# Patient Record
Sex: Male | Born: 1985 | Race: White | Hispanic: No | Marital: Single | Smoking: Former smoker
Health system: Southern US, Community
[De-identification: ages and names within clinical notes are randomized; demographics above are authoritative.]

## PROBLEM LIST (undated history)

## (undated) DIAGNOSIS — F988 Other specified behavioral and emotional disorders with onset usually occurring in childhood and adolescence: Secondary | ICD-10-CM

## (undated) DIAGNOSIS — K760 Fatty (change of) liver, not elsewhere classified: Secondary | ICD-10-CM

## (undated) DIAGNOSIS — K7581 Nonalcoholic steatohepatitis (NASH): Secondary | ICD-10-CM

## (undated) DIAGNOSIS — I447 Left bundle-branch block, unspecified: Secondary | ICD-10-CM

## (undated) DIAGNOSIS — G5600 Carpal tunnel syndrome, unspecified upper limb: Secondary | ICD-10-CM

## (undated) DIAGNOSIS — F419 Anxiety disorder, unspecified: Secondary | ICD-10-CM

## (undated) DIAGNOSIS — R202 Paresthesia of skin: Secondary | ICD-10-CM

## (undated) DIAGNOSIS — R079 Chest pain, unspecified: Secondary | ICD-10-CM

## (undated) DIAGNOSIS — J309 Allergic rhinitis, unspecified: Secondary | ICD-10-CM

## (undated) DIAGNOSIS — E785 Hyperlipidemia, unspecified: Secondary | ICD-10-CM

## (undated) DIAGNOSIS — I10 Essential (primary) hypertension: Secondary | ICD-10-CM

## (undated) DIAGNOSIS — Z6841 Body Mass Index (BMI) 40.0 and over, adult: Secondary | ICD-10-CM

## (undated) DIAGNOSIS — E669 Obesity, unspecified: Secondary | ICD-10-CM

## (undated) HISTORY — DX: Fatty (change of) liver, not elsewhere classified: K76.0

## (undated) HISTORY — DX: Other specified behavioral and emotional disorders with onset usually occurring in childhood and adolescence: F98.8

## (undated) HISTORY — PX: KNEE SURGERY: SHX244

## (undated) HISTORY — DX: Hyperlipidemia, unspecified: E78.5

## (undated) HISTORY — DX: Obesity, unspecified: E66.9

## (undated) HISTORY — DX: Nonalcoholic steatohepatitis (NASH): K75.81

## (undated) HISTORY — DX: Allergic rhinitis, unspecified: J30.9

## (undated) HISTORY — DX: Left bundle-branch block, unspecified: I44.7

## (undated) HISTORY — DX: Chest pain, unspecified: R07.9

## (undated) HISTORY — DX: Carpal tunnel syndrome, unspecified upper limb: G56.00

## (undated) HISTORY — DX: Essential (primary) hypertension: I10

## (undated) HISTORY — DX: Anxiety disorder, unspecified: F41.9

## (undated) HISTORY — DX: Paresthesia of skin: R20.2

## (undated) HISTORY — DX: Body Mass Index (BMI) 40.0 and over, adult: Z684

---
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
## 2009-08-09 ENCOUNTER — Emergency Department (HOSPITAL_COMMUNITY): Admission: EM | Admit: 2009-08-09 | Discharge: 2009-08-09 | Payer: Self-pay | Admitting: Emergency Medicine

## 2010-05-02 LAB — RAPID URINE DRUG SCREEN, HOSP PERFORMED
Amphetamines: POSITIVE — AB
Benzodiazepines: POSITIVE — AB
Opiates: NOT DETECTED

## 2010-05-02 LAB — POCT I-STAT, CHEM 8
BUN: 11 mg/dL (ref 6–23)
Calcium, Ion: 1.13 mmol/L (ref 1.12–1.32)
Chloride: 104 mEq/L (ref 96–112)
Creatinine, Ser: 1 mg/dL (ref 0.4–1.5)
Potassium: 3.7 mEq/L (ref 3.5–5.1)
TCO2: 22 mmol/L (ref 0–100)

## 2010-05-02 LAB — URINALYSIS, ROUTINE W REFLEX MICROSCOPIC
Bilirubin Urine: NEGATIVE
Glucose, UA: NEGATIVE mg/dL
Nitrite: NEGATIVE
Protein, ur: NEGATIVE mg/dL
Urobilinogen, UA: 0.2 mg/dL (ref 0.0–1.0)

## 2010-05-02 LAB — POCT CARDIAC MARKERS
CKMB, poc: 2 ng/mL (ref 1.0–8.0)
Myoglobin, poc: 118 ng/mL (ref 12–200)

## 2012-08-22 ENCOUNTER — Other Ambulatory Visit: Payer: Self-pay | Admitting: Family Medicine

## 2012-08-28 ENCOUNTER — Ambulatory Visit
Admission: RE | Admit: 2012-08-28 | Discharge: 2012-08-28 | Disposition: A | Discharge status: Home / Self Care | Payer: BC Managed Care – PPO | Source: Ambulatory Visit | Attending: Family Medicine | Admitting: Family Medicine

## 2013-02-28 ENCOUNTER — Encounter: Payer: BC Managed Care – PPO | Attending: Family Medicine | Admitting: *Deleted

## 2013-02-28 ENCOUNTER — Encounter: Payer: Self-pay | Admitting: *Deleted

## 2013-02-28 VITALS — Ht 70.5 in | Wt 325.0 lb

## 2013-02-28 DIAGNOSIS — Z87891 Personal history of nicotine dependence: Secondary | ICD-10-CM | POA: Insufficient documentation

## 2013-02-28 DIAGNOSIS — E785 Hyperlipidemia, unspecified: Secondary | ICD-10-CM | POA: Insufficient documentation

## 2013-02-28 DIAGNOSIS — K760 Fatty (change of) liver, not elsewhere classified: Secondary | ICD-10-CM

## 2013-02-28 DIAGNOSIS — E669 Obesity, unspecified: Secondary | ICD-10-CM | POA: Insufficient documentation

## 2013-02-28 DIAGNOSIS — K7689 Other specified diseases of liver: Secondary | ICD-10-CM | POA: Insufficient documentation

## 2013-02-28 DIAGNOSIS — Z713 Dietary counseling and surveillance: Secondary | ICD-10-CM | POA: Insufficient documentation

## 2013-02-28 NOTE — Patient Instructions (Signed)
Aim for physical activity 2 days: water, elliptical, recumbent bike Switch to vegetable oil for cooking instead of butter or margarine Incorporate more whole grains at home: brown rice, whole wheat bread, wheat pasta Try frozen vegetables each night Take apples as snacks  Try grilled chicken instead of fried chicken tenders at work Switch salad dressing to [place] or vinaigrette instead of ranch Avoid trans fats: margarine, crisco, prepackage baked goods Pizza once a week with thin crust, cheese   And have vegetable with pizza  Experiment with different lunch and breakfast.  Buy groceries for H. J. Heinztheses meals (egg muffin sandwich) Bring something for lunch  Turn off tv while eating.  Slow down.  Aim to make meals last at least 20 minutes.

## 2013-02-28 NOTE — Progress Notes (Signed)
Medical Nutrition Therapy:  Appt start time: 0800 end time:  0900.   Assessment:  Primary concerns today: Jamie Wolf is here for nutrition counseling pertaining to hyperlipidemia and fatty liver, exacerbated by obesity.  He used to smoke and after he quit he gained over 30 pounds.  He played sports in high school and was never concerned about his weight, but now he's not as active.  He is worried about his health and his knees.   Several family members have had bariatric surgery and he doesn't want to have to make those decisions.  He has attempted to lose weight on his own through [employer] and lost over 40 pounds.  He also exercised more on that diet, but then he stopped the diet and gained it back.   As an adult his heaviest weight is currently 325.  His lowest as an adult was 240 lb and he would like to weigh 250.   He lives with his fiance.  They share food shopping responsibility and cooking responsibility.  They cook more at home now; they used to eat out more.  His fiance is trying to be healthy too so Jamie Wolf has support.  They eat out 1-2 times a week, vs 3-4 times.  They do not fry anything at home.  They eat while watching tv.  He reports being a really fast eater.   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  He works 7-5 5 tays a week. most of the time.  Sometimes his schedule changes though.  He describes himself as a binge-eater.  Especially on the days off he will eat a lot at one time: whole pizza.  He states that he craves carbs.  He rarely drinks alcohol.    Preferred Learning Style:   Auditory  Learning Readiness:   Ready  MEDICATIONS: Vyvanse  DIETARY INTAKE:  Usual eating pattern includes 1-3 meals and maybe ad lib throughout the day.  Everyday foods include starches, fatty meats.      24-hr recall:  When off work, he grazes throughout the day B ( AM): McDonald's with jucie.  On days off used to go out also.  Now is  doing [place] bacon Snk ( AM):  Not at work L ( PM): something from work Snk ( PM): not at work D ( PM): rice and beans; spaghetti.  Might go out: [place] hibachi (stopped getting fried rice, tries to get steamed or brown rice) Snk ( PM): leftovers from dinner.  Chips and salsa Beverages: water with [place]. Powerade Zero   Usual physical activity: stands and walks all day at work.  Walks dog.  Knees hurt on concrete  Estimated energy needs: 2400 calories 270 g carbohydrates 180 g protein 67 g fat    Nutritional Diagnosis:  NI-5.6.2 Excessive fat intake As related to excessive consumption of saturated fats from fast food and pizza.  As evidenced by elevated cholesterol and fatty liver.    Intervention:  Nutrition counseling provided. Discussed heart healthy eating and limiting saturated and trans fats. Aim for physical activity 2 days: water, elliptical, recumbent bike Switch to vegetable oil for cooking instead of butter or margarine Incorporate more whole grains at home: brown rice, whole wheat bread, wheat pasta Try frozen vegetables each night Take apples as snacks Try grilled chicken instead of fried chicken tenders at work Switch salad dressing to [place] or vinaigrette instead of ranch Avoid trans  fats: margarine, crisco, prepackage baked goods Pizza once a week with thin crust, cheese   And have vegetable with pizza Experiment with different lunch and breakfast.  Buy groceries for theses meals (egg muffin sandwich) Bring something for lunch Turn off tv while eating.  Slow down.  Aim to make meals last at least 20 minutes.  Teaching Method Utilized:  Auditory  Handouts given during visit include: none  Barriers to learning/adherence to lifestyle change: foods at work, busy work schedule  Demonstrated degree of understanding via:  Teach Back   Monitoring/Evaluation:  Dietary intake, exercise, and body weight in 3 month(s).

## 2013-05-30 ENCOUNTER — Ambulatory Visit: Payer: BC Managed Care – PPO | Admitting: *Deleted

## 2016-07-01 ENCOUNTER — Other Ambulatory Visit: Payer: Self-pay | Admitting: Family Medicine

## 2016-07-01 DIAGNOSIS — R202 Paresthesia of skin: Secondary | ICD-10-CM

## 2016-07-08 ENCOUNTER — Ambulatory Visit
Admission: RE | Admit: 2016-07-08 | Discharge: 2016-07-08 | Disposition: A | Discharge status: Home / Self Care | Payer: 59 | Source: Ambulatory Visit | Attending: Family Medicine | Admitting: Family Medicine

## 2016-07-08 DIAGNOSIS — R202 Paresthesia of skin: Secondary | ICD-10-CM

## 2016-07-08 MED ORDER — GADOBENATE DIMEGLUMINE 529 MG/ML IV SOLN
20.0000 mL | Freq: Once | INTRAVENOUS | Status: AC | PRN
  Administered 2016-07-08: 20 mL via INTRAVENOUS

## 2017-06-12 ENCOUNTER — Other Ambulatory Visit (HOSPITAL_COMMUNITY): Payer: Self-pay | Admitting: Family Medicine

## 2017-06-12 DIAGNOSIS — I447 Left bundle-branch block, unspecified: Secondary | ICD-10-CM

## 2017-06-13 ENCOUNTER — Telehealth: Payer: Self-pay

## 2017-06-13 NOTE — Telephone Encounter (Signed)
Sent referral to scheduling 

## 2017-06-14 ENCOUNTER — Other Ambulatory Visit: Payer: Self-pay

## 2017-06-14 ENCOUNTER — Ambulatory Visit (HOSPITAL_COMMUNITY): Payer: Managed Care, Other (non HMO) | Attending: Cardiology

## 2017-06-14 DIAGNOSIS — R002 Palpitations: Secondary | ICD-10-CM | POA: Insufficient documentation

## 2017-06-14 DIAGNOSIS — R079 Chest pain, unspecified: Secondary | ICD-10-CM | POA: Insufficient documentation

## 2017-06-14 DIAGNOSIS — F410 Panic disorder [episodic paroxysmal anxiety] without agoraphobia: Secondary | ICD-10-CM | POA: Insufficient documentation

## 2017-06-14 DIAGNOSIS — Z87891 Personal history of nicotine dependence: Secondary | ICD-10-CM | POA: Insufficient documentation

## 2017-06-14 DIAGNOSIS — E785 Hyperlipidemia, unspecified: Secondary | ICD-10-CM | POA: Diagnosis not present

## 2017-06-14 DIAGNOSIS — I10 Essential (primary) hypertension: Secondary | ICD-10-CM | POA: Diagnosis not present

## 2017-06-14 DIAGNOSIS — Z8249 Family history of ischemic heart disease and other diseases of the circulatory system: Secondary | ICD-10-CM | POA: Insufficient documentation

## 2017-06-14 DIAGNOSIS — I447 Left bundle-branch block, unspecified: Secondary | ICD-10-CM | POA: Diagnosis not present

## 2017-06-14 MED ORDER — PERFLUTREN LIPID MICROSPHERE
1.0000 mL | INTRAVENOUS | Status: AC | PRN
  Administered 2017-06-14: 2 mL via INTRAVENOUS

## 2017-06-29 ENCOUNTER — Encounter: Payer: Self-pay | Admitting: Interventional Cardiology

## 2017-06-29 ENCOUNTER — Ambulatory Visit (INDEPENDENT_AMBULATORY_CARE_PROVIDER_SITE_OTHER): Payer: Managed Care, Other (non HMO) | Admitting: Interventional Cardiology

## 2017-06-29 VITALS — BP 128/70 | HR 100 | Ht 70.5 in | Wt 325.0 lb

## 2017-06-29 DIAGNOSIS — R079 Chest pain, unspecified: Secondary | ICD-10-CM

## 2017-06-29 NOTE — Patient Instructions (Signed)
Medication Instructions:  Your physician recommends that you continue on your current medications as directed. Please refer to the Current Medication list given to you today.   Labwork: None ordered  Testing/Procedures: Your physician has requested that you have an exercise tolerance test. For further information please visit [email or web address] Please also follow instruction sheet, as given.  Follow-Up: Your physician recommends that you follow-up with Dr. Eldridge Dace AS NEEDED   Any Other Special Instructions Will Be Listed Below (If Applicable).   Exercise Stress Electrocardiogram An exercise stress electrocardiogram is a test to check how blood flows to your heart. It is done to find areas of poor blood flow. You will need to walk on a treadmill for this test. The electrocardiogram will record your heartbeat when you are at rest and when you are exercising. What happens before the procedure?  Do not have drinks with caffeine or foods with caffeine for 24 hours before the test, or as told by your doctor. This includes coffee, tea (even decaf tea), sodas, chocolate, and cocoa.  Follow your doctor's instructions about eating and drinking before the test.  Ask your doctor what medicines you should or should not take before the test. Take your medicines with water unless told by your doctor not to.  If you use an inhaler, bring it with you to the test.  Bring a snack to eat after the test.  Do not  smoke for 4 hours before the test.  Do not put lotions, powders, creams, or oils on your chest before the test.  Wear comfortable shoes and clothing. What happens during the procedure?  You will have patches put on your chest. Small areas of your chest may need to be shaved. Wires will be connected to the patches.  Your heart rate will be watched while you are resting and while you are exercising.  You will walk on the treadmill. The treadmill will slowly get faster to raise your  heart rate.  The test will take about 1-2 hours. What happens after the procedure?  Your heart rate and blood pressure will be watched after the test.  You may return to your normal diet, activities, and medicines or as told by your doctor. This information is not intended to replace advice given to you by your health care provider. Make sure you discuss any questions you have with your health care provider. Document Released: 07/20/2007 Document Revised: 09/30/2015 Document Reviewed: 10/08/2012 Elsevier Interactive Patient Education  [employer].    If you need a refill on your cardiac medications before your next appointment, please call your pharmacy.

## 2017-06-29 NOTE — Progress Notes (Signed)
Cardiology Office Note   Date:  06/29/2017   ID:  Jamie Wolf, DOB 1985/10/07, MRN [id number]  PCP:  Mila Palmer, MD    No chief complaint on file.  Chest discomfort  Wt Readings from Last 3 Encounters:  06/29/17 (!) 325 lb (147.4 kg)  02/28/13 (!) 325 lb (147.4 kg)       History of Present Illness: Jamie Wolf is a 32 y.o. male who is being seen today for the evaluation of chest discomfort at the request of Mila Palmer, MD.  He has had vague chest, shoulder and arm discomfort/tingling with anxiety.  It occurred recently with MJ smoking.  He stopped this several weeks ago.  Sx are better, but not gone.  After he found out that his echo was normal, sx are gone.  No associated sweating or nausea.   He describes the sensation of a skipped beat with stress and immediately after exercise.    His father uses Ativan for anxiety.  He plays golf and plays tennis.  He walks his dogs.  He plans to go to the gym when the cardiac w/u is done.  He has never had to stop exercise due to any sx.    Denies : Dizziness. Leg edema. Nitroglycerin use. Orthopnea. Palpitations. Paroxysmal nocturnal dyspnea. Shortness of breath. Syncope.       Past Medical History:  Diagnosis Date  . ADD (attention deficit disorder)   . Allergic rhinitis   . Anxiety   . BMI 40.0-44.9, adult (HCC)   . Carpal tunnel syndrome   . Chest pain   . Fatty liver   . Hyperlipidemia   . Hypertension   . LBBB (left bundle branch block)   . Nonalcoholic steatohepatitis   . Obesity   . Paresthesia     Past Surgical History:  Procedure Laterality Date  . KNEE SURGERY       Current Outpatient Medications  Medication Sig Dispense Refill  . omeprazole (PRILOSEC) 20 MG capsule Take 20 mg by mouth daily.     No current facility-administered medications for this visit.     Allergies:   Amoxicillin; Penicillin g; and Penicillins    Social History:  The patient  reports that he has quit  smoking. He has never used smokeless tobacco. He reports that he drinks alcohol. He reports that he has current or past drug history.   Family History:  The patient's family history includes Asthma in his other; Cancer in his other; Diabetes in his other; Heart attack in his other; Hyperlipidemia in his other; Obesity in his other; Other in his sister and sister.    ROS:  Please see the history of present illness.   Otherwise, review of systems are positive for difficulty losing weight.   All other systems are reviewed and negative.    PHYSICAL EXAM: VS:  BP 128/70 (BP Location: Left Arm, Patient Position: Sitting, Cuff Size: Large)   Pulse 100   Ht 5' 10.5" (1.791 m)   Wt (!) 325 lb (147.4 kg)   SpO2 98%   BMI 45.97 kg/m  , BMI Body mass index is 45.97 kg/m. GEN: Well nourished, well developed, in no acute distress  HEENT: normal  Neck: no JVD, carotid bruits, or masses Cardiac: RRR; no murmurs, rubs, or gallops,no edema, 2+ right dorsalis pedis pulse Respiratory:  clear to auscultation bilaterally, normal work of breathing GI: soft, nontender, nondistended, + BS, obese MS: no deformity or atrophy  Skin: warm and dry, no  rash Neuro:  Strength and sensation are intact Psych: euthymic mood, full affect   EKG:   The ekg ordered today demonstrates NSR, no ST changes   Recent Labs: No results found for requested labs within last 8760 hours.   Lipid Panel No results found for: CHOL, TRIG, HDL, CHOLHDL, VLDL, LDLCALC, LDLDIRECT   Other studies Reviewed: Additional studies/ records that were reviewed today with results demonstrating: Labs reviewed.  LDL 104 in April 2019.  A1c 4.9, creatinine 0.93.   ASSESSMENT AND PLAN:  1. Chest discomfort: Very atypical.  I think it is unlikely that his symptoms are due to ischemia.  However, he is planning on getting on an exercise regimen.  We will plan for exercise treadmill test to provide reassurance and to give him targets for  exercise. 2. Obesity: We spoke about eating quality foods and avoiding starches and prepackaged foods.  He needs to avoid sugary drinks as well.  This would likely provide an immediate weight loss which would be sustainable. 3. LDL 104 at most recent check. 4. I reviewed his ECG.  I do not see any significant abnormality, certainly not anything that would interfere with the interpretation of the stress test.  He does not have a left bundle branch block.   Current medicines are reviewed at length with the patient today.  The patient concerns regarding his medicines were addressed.  The following changes have been made:  No change  Labs/ tests ordered today include:  No orders of the defined types were placed in this encounter.   Recommend 150 minutes/week of aerobic exercise Low fat, low carb, high fiber diet recommended  Disposition:   FU for treadmill test   Signed, Lance Muss, MD  06/29/2017 10:37 AM    [facility] [address] [place], [place], [place]  [address] Phone: [phone]; Fax: [phone]

## 2019-04-21 IMAGING — MR MR HEAD WO/W CM
14 series · 48 of 48 positions shown · IV contrast (multihance)
Comparison: None.

CLINICAL DATA: Left and occasional right lower extremity
paresthesias for 4 months. Evaluation for multiple sclerosis.

EXAM:
MRI HEAD WITHOUT AND WITH CONTRAST
TECHNIQUE: Multiplanar, multiecho pulse sequences of the brain and surrounding
structures were obtained without and with intravenous contrast.
CONTRAST:  20mL MULTIHANCE GADOBENATE DIMEGLUMINE 529 MG/ML IV SOLN

[Series 5: T1 · sagittal · 4.0mm · 0.75mm/px · 1 of 31 slices shown (1 of 3)]
[im 1/31]
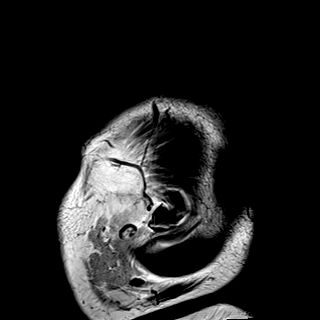

[Series 6: DWI · axial · 3.0mm · 1.44mm/px · z∈[-62,+86]mm · 5 of 92 slices shown (1 of 4)]
[im 1/92]
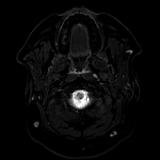
[im 23/92]
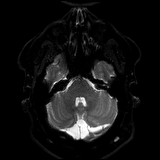
[im 46/92]
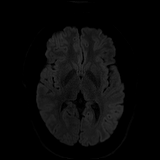
[im 69/92]
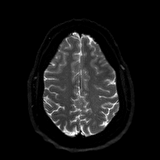
[im 92/92]
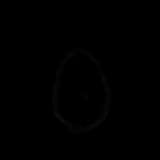

[Series 7: DWI · axial · 3.0mm · 1.44mm/px · z∈[-62,+86]mm · 3 of 46 slices shown (2 of 4)]
[im 1/46]
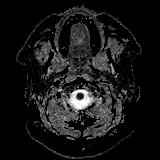
[im 23/46]
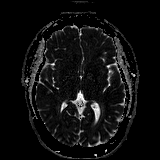
[im 46/46]
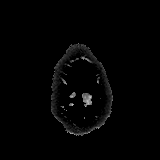

[Series 8: DWI · coronal · 5.0mm · 1.44mm/px · 3 of 60 slices shown (3 of 4)]
[im 1/60]
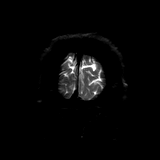
[im 30/60]
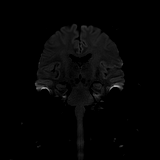
[im 60/60]
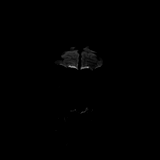

[Series 9: DWI · coronal · 5.0mm · 1.44mm/px · 2 of 30 slices shown (4 of 4)]
[im 1/30]
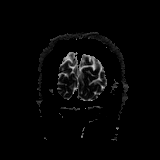
[im 30/30]
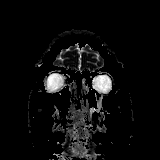

[Series 10: T2 · axial · 4.0mm · 0.36mm/px · z∈[-66,+89]mm · 2 of 31 slices shown]
[im 1/31]
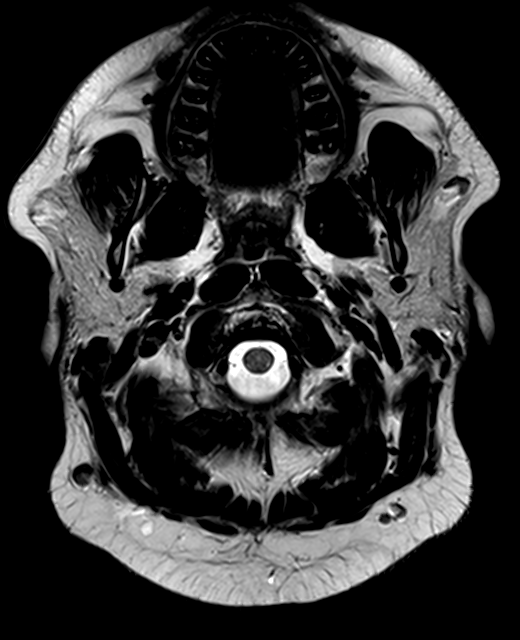
[im 31/31]
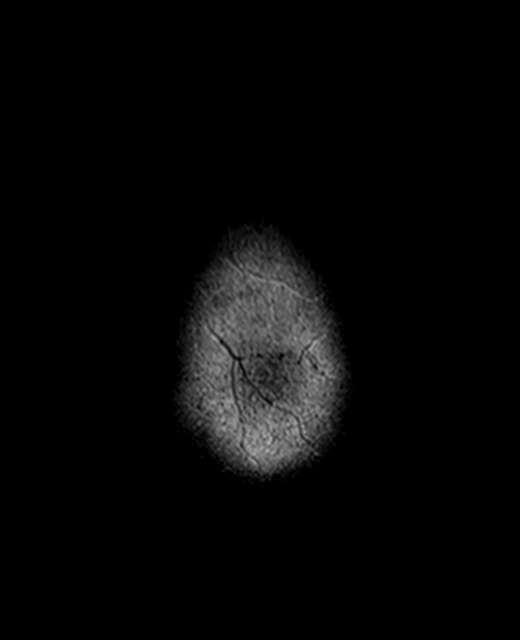

[Series 11: FLAIR · axial · 3.0mm · 0.72mm/px · 1 of 26 slices shown (1 of 2)]
[im 1/26]
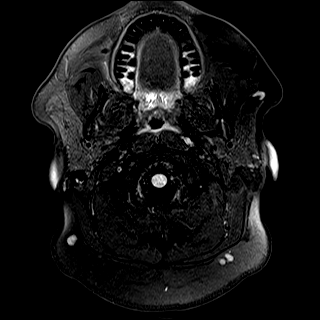

[Series 13: swi_images · axial · 1.5mm · 0.90mm/px · z∈[-59,+82]mm · 5 of 96 slices shown]
[im 1/96]
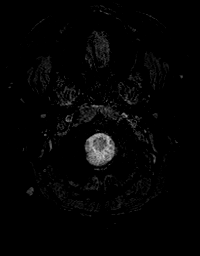
[im 24/96]
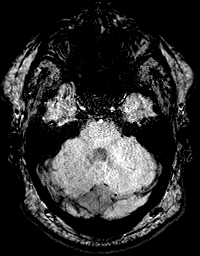
[im 48/96]
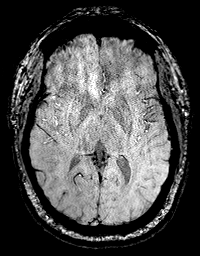
[im 72/96]
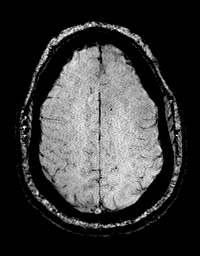
[im 96/96]
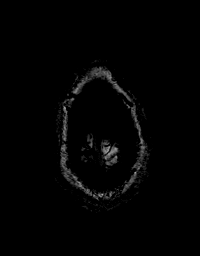

[Series 14: T1 · axial · 1.0mm · 0.90mm/px · z∈[-67,+90]mm · 9 of 160 slices shown (2 of 3)]
[im 1/160]
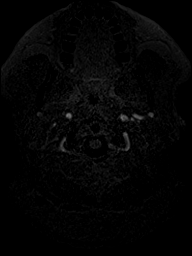
[im 20/160]
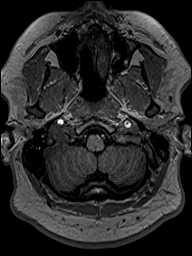
[im 40/160]
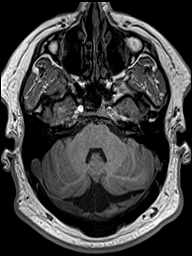
[im 60/160]
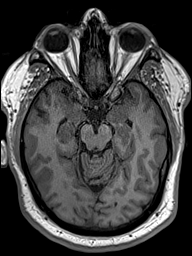
[im 80/160]
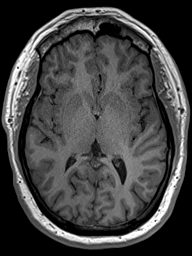
[im 100/160]
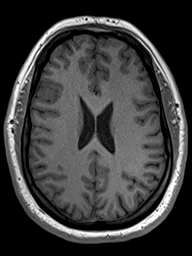
[im 120/160]
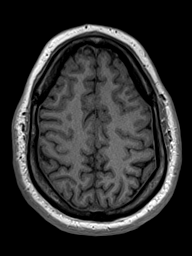
[im 140/160]
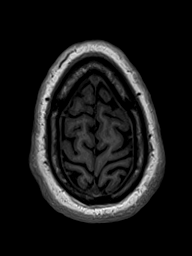
[im 160/160]
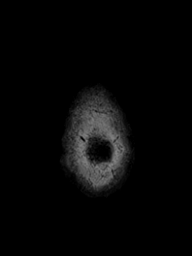

[Series 15: FLAIR · sagittal · 4.0mm · 0.72mm/px · 2 of 27 slices shown (2 of 2)]
[im 1/27]
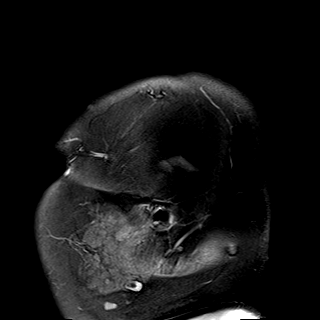
[im 27/27]
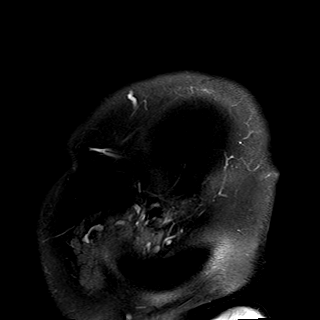

[Series 16: T2 post-contrast · coronal · 4.5mm · 0.36mm/px · 2 of 32 slices shown]
[im 1/32]
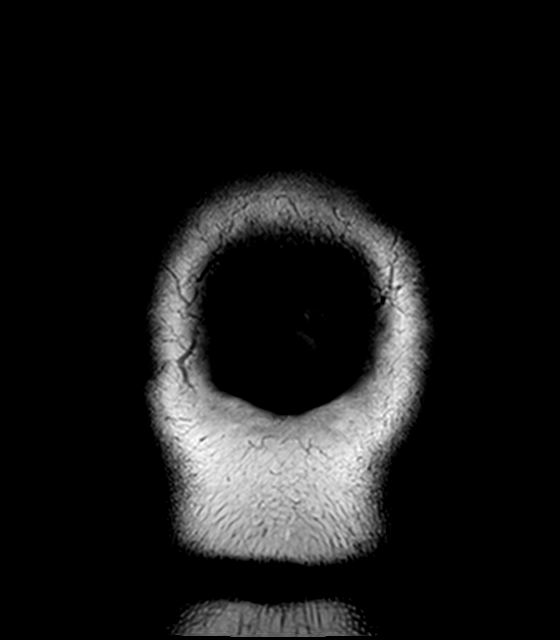
[im 32/32]
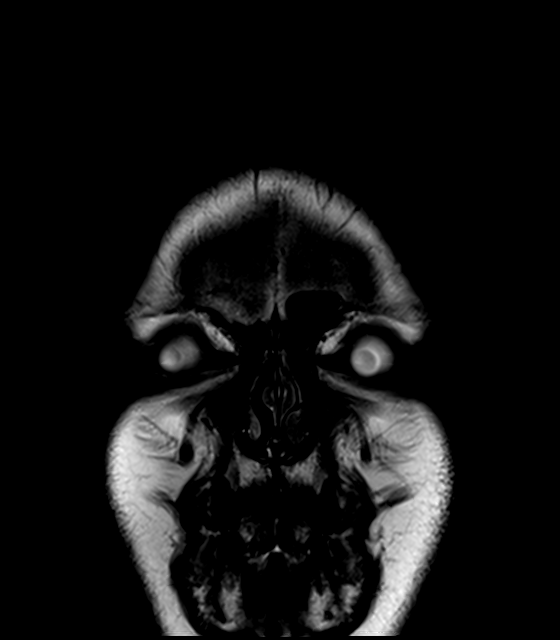

[Series 17: T1 · axial · 1.0mm · 0.90mm/px · z∈[-67,+90]mm · 9 of 160 slices shown (3 of 3)]
[im 1/160]
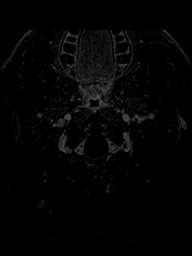
[im 20/160]
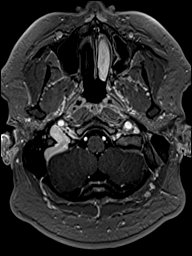
[im 40/160]
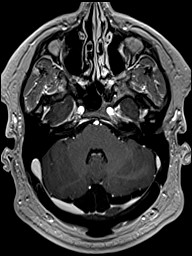
[im 60/160]
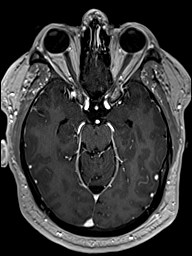
[im 80/160]
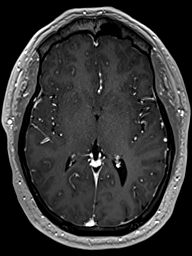
[im 100/160]
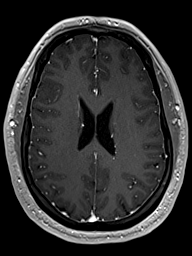
[im 120/160]
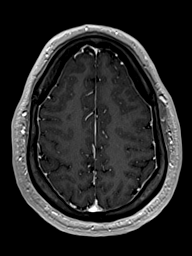
[im 140/160]
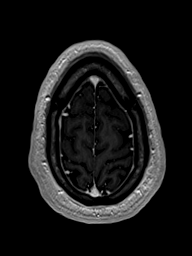
[im 160/160]
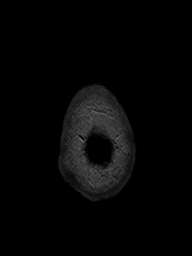

[Series 18: T1 post-contrast · coronal · 4.5mm · 0.72mm/px · 2 of 32 slices shown (1 of 2)]
[im 1/32]
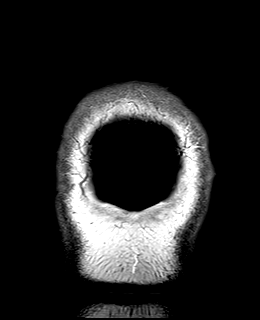
[im 32/32]
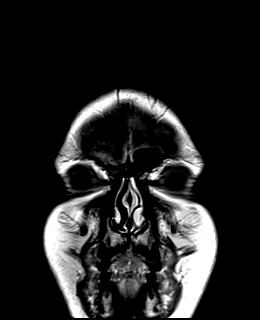

[Series 19: T1 post-contrast · sagittal · 4.0mm · 0.75mm/px · 2 of 31 slices shown (2 of 2)]
[im 1/31]
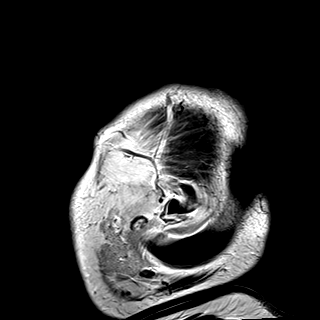
[im 31/31]
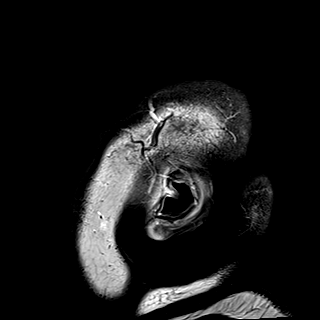

[48 of 48 positions shown; findings below may reference images not displayed]

FINDINGS: Brain: There is no evidence of acute infarct, intracranial
hemorrhage, mass, midline shift, or extra-axial fluid collection.
The ventricles and sulci are normal. The brain is normal in signal.
Specifically, no white matter changes are seen to suggest
demyelinating disease. A mega cisterna magna is incidentally noted,
a normal variant. No abnormal enhancement is identified.

Vascular: Major intracranial vascular flow voids are preserved.

Skull and upper cervical spine: Unremarkable bone marrow signal.

Sinuses/Orbits: Unremarkable orbits. Minimal left maxillary sinus
mucosal thickening. Trace right mastoid fluid.

Other: None.
IMPRESSION: Negative MR appearance of the brain. No evidence of demyelinating
disease.

## 2021-07-20 ENCOUNTER — Other Ambulatory Visit: Payer: Self-pay

## 2021-07-20 ENCOUNTER — Encounter (HOSPITAL_BASED_OUTPATIENT_CLINIC_OR_DEPARTMENT_OTHER): Payer: Self-pay

## 2021-07-20 ENCOUNTER — Emergency Department (HOSPITAL_BASED_OUTPATIENT_CLINIC_OR_DEPARTMENT_OTHER): Payer: Managed Care, Other (non HMO)

## 2021-07-20 ENCOUNTER — Emergency Department (HOSPITAL_BASED_OUTPATIENT_CLINIC_OR_DEPARTMENT_OTHER)
Admission: EM | Admit: 2021-07-20 | Discharge: 2021-07-20 | Disposition: A | Discharge status: Home / Self Care | Payer: Managed Care, Other (non HMO) | Attending: Emergency Medicine | Admitting: Emergency Medicine

## 2021-07-20 DIAGNOSIS — R002 Palpitations: Secondary | ICD-10-CM | POA: Insufficient documentation

## 2021-07-20 DIAGNOSIS — R5383 Other fatigue: Secondary | ICD-10-CM | POA: Diagnosis not present

## 2021-07-20 LAB — COMPREHENSIVE METABOLIC PANEL
ALT: 25 U/L (ref 0–44)
AST: 25 U/L (ref 15–41)
Albumin: 4 g/dL (ref 3.5–5.0)
Alkaline Phosphatase: 55 U/L (ref 38–126)
Anion gap: 8 (ref 5–15)
BUN: 14 mg/dL (ref 6–20)
CO2: 23 mmol/L (ref 22–32)
Calcium: 8.7 mg/dL — ABNORMAL LOW (ref 8.9–10.3)
Chloride: 107 mmol/L (ref 98–111)
Creatinine, Ser: 0.86 mg/dL (ref 0.61–1.24)
GFR, Estimated: >60 mL/min (ref >60)
Glucose, Bld: 106 mg/dL — ABNORMAL HIGH (ref 70–99)
Potassium: 3.5 mmol/L (ref 3.5–5.1)
Sodium: 138 mmol/L (ref 135–145)
Total Bilirubin: 1 mg/dL (ref 0.3–1.2)
Total Protein: 7.2 g/dL (ref 6.5–8.1)

## 2021-07-20 LAB — CBC WITH DIFFERENTIAL/PLATELET
Abs Immature Granulocytes: 0.16 10*3/uL — ABNORMAL HIGH (ref 0.00–0.07)
Basophils Absolute: 0 10*3/uL (ref 0.0–0.1)
Basophils Relative: 1 %
Eosinophils Absolute: 0.1 10*3/uL (ref 0.0–0.5)
Eosinophils Relative: 1 %
HCT: 42.7 % (ref 39.0–52.0)
Hemoglobin: 14.6 g/dL (ref 13.0–17.0)
Immature Granulocytes: 2 %
Lymphocytes Relative: 16 %
Lymphs Abs: 1.4 10*3/uL (ref 0.7–4.0)
MCH: 28.7 pg (ref 26.0–34.0)
MCHC: 34.2 g/dL (ref 30.0–36.0)
MCV: 83.9 fL (ref 80.0–100.0)
Monocytes Absolute: 0.6 10*3/uL (ref 0.1–1.0)
Monocytes Relative: 7 %
Neutro Abs: 6.5 10*3/uL (ref 1.7–7.7)
Neutrophils Relative %: 73 %
Platelets: 239 10*3/uL (ref 150–400)
RBC: 5.09 MIL/uL (ref 4.22–5.81)
RDW: 14.7 % (ref 11.5–15.5)
WBC: 8.7 10*3/uL (ref 4.0–10.5)
nRBC: 0 % (ref 0.0–0.2)

## 2021-07-20 LAB — MAGNESIUM: Magnesium: 2.1 mg/dL (ref 1.7–2.4)

## 2021-07-20 NOTE — Discharge Instructions (Signed)
Follow-up with your primary care provider for further evaluation. Return to the ER if you start to experience chest pain, shortness of breath, uncontrollable vomiting, increase in your palpitations, leg swelling or coughing up blood

## 2021-07-20 NOTE — ED Provider Notes (Signed)
[facility] EMERGENCY DEPARTMENT Provider Note   CSN: [id number] Arrival date & time: 07/20/21  1045     History  Chief Complaint  Patient presents with   Palpitations    Jamie Wolf is a 36 y.o. male presenting to the ED with a chief complaint of palpitations and fluttering in his chest.  Symptoms began when he woke up this morning.  Reports associated "feeling flushed" and generally fatigued.  Felt like his heart rate was increasing and then decreasing without any specific cause while he was sitting in his bed.  He was started on oral steroids on 07/17/2021 for supposedly a poison oak rash.  He has taken steroids in the past but has never experienced the symptoms before.  He denies any chest pain, abdominal pain, nausea, vomiting, diarrhea, fever, leg swelling, recent immobilization, history of DVT or PE.  Patient was evaluated by cardiology approximately 4 years ago for atypical chest pain.  He underwent an echo and was cleared from a cardiology standpoint after reassuring work-up.  He denies any other new medication or lifestyle changes.  He is currently on Adderall but has been on this medication for about 10 years.  HPI     Home Medications Prior to Admission medications   Medication Sig Start Date End Date Taking? Authorizing Provider  omeprazole (PRILOSEC) 20 MG capsule Take 20 mg by mouth daily.    [provider]      Allergies    Amoxicillin, Penicillin g, and Penicillins    Review of Systems   Review of Systems  Constitutional:  Negative for appetite change, chills and fever.  HENT:  Negative for ear pain, rhinorrhea, sneezing and sore throat.   Eyes:  Negative for photophobia and visual disturbance.  Respiratory:  Negative for cough, chest tightness, shortness of breath and wheezing.   Cardiovascular:  Positive for palpitations. Negative for chest pain.  Gastrointestinal:  Negative for abdominal pain, blood in stool, constipation, diarrhea, nausea  and vomiting.  Genitourinary:  Negative for dysuria, hematuria and urgency.  Musculoskeletal:  Negative for myalgias.  Skin:  Negative for rash.  Neurological:  Negative for dizziness, weakness and light-headedness.   Physical Exam Updated Vital Signs BP 128/82   Pulse 66   Temp 98 F (36.7 C) (Oral)   Resp 20   Ht 6' (1.829 m)   Wt 132 kg   SpO2 96%   BMI 39.47 kg/m  Physical Exam Vitals and nursing note reviewed.  Constitutional:      General: He is not in acute distress.    Appearance: He is well-developed.     Comments: Speaking complete sentence without difficulty.  No signs of distress.  HENT:     Head: Normocephalic and atraumatic.     Nose: Nose normal.  Eyes:     General: No scleral icterus.       Left eye: No discharge.     Conjunctiva/sclera: Conjunctivae normal.  Cardiovascular:     Rate and Rhythm: Normal rate and regular rhythm.     Heart sounds: Normal heart sounds. No murmur heard.   No friction rub. No gallop.  Pulmonary:     Effort: Pulmonary effort is normal. No respiratory distress.     Breath sounds: Normal breath sounds.  Abdominal:     General: Bowel sounds are normal. There is no distension.     Palpations: Abdomen is soft.     Tenderness: There is no abdominal tenderness. There is no guarding.  Musculoskeletal:  General: Normal range of motion.     Cervical back: Normal range of motion and neck supple.     Comments: No lower extremity edema, erythema or calf tenderness bilaterally.  Skin:    General: Skin is warm and dry.     Findings: No rash.  Neurological:     Mental Status: He is alert.     Motor: No abnormal muscle tone.     Coordination: Coordination normal.    ED Results / Procedures / Treatments   Labs (all labs ordered are listed, but only abnormal results are displayed) Labs Reviewed  COMPREHENSIVE METABOLIC PANEL - Abnormal; Notable for the following components:      Result Value   Glucose, Bld 106 (*)    Calcium  8.7 (*)    All other components within normal limits  CBC WITH DIFFERENTIAL/PLATELET - Abnormal; Notable for the following components:   Abs Immature Granulocytes 0.16 (*)    All other components within normal limits  MAGNESIUM    EKG EKG Interpretation  Date/Time:  Tuesday July 20 2021 11:02:41 EDT Ventricular Rate:  72 PR Interval:  149 QRS Duration: 106 QT Interval:  381 QTC Calculation: 417 R Axis:   68 Text Interpretation: Sinus rhythm No significant change since last tracing Confirmed by Alvira Monday ([id number]) on 07/20/2021 11:14:28 AM  Radiology DG Chest 2 View  Result Date: 07/20/2021 CLINICAL DATA:  Palpitations EXAM: CHEST - 2 VIEW COMPARISON:  Chest radiograph dated August 09, 2009 FINDINGS: The heart size and mediastinal contours are within normal limits. Both lungs are clear. The visualized skeletal structures are unremarkable. IMPRESSION: No active cardiopulmonary disease. Electronically Signed   By: Larose Hires D.O.   On: 07/20/2021 11:29    Procedures Procedures    Medications Ordered in ED Medications - No data to display  ED Course/ Medical Decision Making/ A&P Clinical Course as of 07/20/21 1233  Tue Jul 20, 2021  1221 Magnesium: 2.1 [HK]    Clinical Course User Index [HK] Dietrich Pates, PA-C                           Medical Decision Making Amount and/or Complexity of Data Reviewed Labs: ordered. Decision-making details documented in ED Course. Radiology: ordered.   36 year old male presenting to the ED for palpitations and fatigue.  Symptoms began when he woke up this morning.  Has been on oral steroids for poison oak rash about 4 days ago and was told that this could have been caused by his steroids.  However the additional symptoms like fatigue and chills is what presented him to the ER today.  He denies any chest pain, abdominal pain, nausea, vomiting or documented fevers.  States that he feels like his heart rate will increase and go back to normal  without intervention.  Cleared by cardiology after reassuring work-up for atypical chest pain in 2019.  On exam patient appears well.  Pulse appears regular.  No signs of distress noted.  Lungs are clear bilaterally.  No lower extremity edema, erythema or calf tenderness bilaterally that would concern me for DVT.  Will obtain labs, EKG and reassess.  EKG shows normal sinus rhythm, no ischemic changes, no STEMI, no changes from prior tracings.  Chest x-ray without any acute findings or structural cause of symptoms.  CBC, CMP and magnesium are normal here.  I doubt PE as a cause of his symptoms as he has PERC negative and his vital signs  are within normal limits here.  He is not tachycardic or hypoxic.  I doubt ACS based on lack of symptoms and medical history.  I suspect that his symptoms are likely due to his prednisone use as this is a side effect of steroid use.  Patient feels comfortable being discharged home.  He is hemodynamically stable here and has not experienced symptoms while being in the ER.  Return precautions given    Patient is hemodynamically stable, in NAD, and able to ambulate in the ED. Evaluation does not show pathology that would require ongoing emergent intervention or inpatient treatment. I explained the diagnosis to the patient. Pain has been managed and has no complaints prior to discharge. Patient is comfortable with above plan and is stable for discharge at this time. All questions were answered prior to disposition. Strict return precautions for returning to the ED were discussed. Encouraged follow up with PCP.   An After Visit Summary was printed and given to the patient.   Portions of this note were generated with Scientist, clinical (histocompatibility and immunogenetics). Dictation errors may occur despite best attempts at proofreading.         Final Clinical Impression(s) / ED Diagnoses Final diagnoses:  Palpitations    Rx / DC Orders ED Discharge Orders     None         Dietrich Pates,  PA-C 07/20/21 1233    Alvira Monday, MD 07/20/21 2211

## 2021-07-20 NOTE — ED Triage Notes (Signed)
Pt states fluttering sensation in chest beginning this morning.  States irregular heart rate.  Taking prednisone for poison oak. Denies chest pain, denies shortness of breath.

## 2021-12-20 DIAGNOSIS — F4323 Adjustment disorder with mixed anxiety and depressed mood: Secondary | ICD-10-CM | POA: Diagnosis not present

## 2022-01-05 DIAGNOSIS — Z13220 Encounter for screening for lipoid disorders: Secondary | ICD-10-CM | POA: Diagnosis not present

## 2022-01-05 DIAGNOSIS — Z79899 Other long term (current) drug therapy: Secondary | ICD-10-CM | POA: Diagnosis not present

## 2022-01-05 DIAGNOSIS — E559 Vitamin D deficiency, unspecified: Secondary | ICD-10-CM | POA: Diagnosis not present

## 2022-01-05 DIAGNOSIS — Z23 Encounter for immunization: Secondary | ICD-10-CM | POA: Diagnosis not present

## 2022-01-05 DIAGNOSIS — Z0000 Encounter for general adult medical examination without abnormal findings: Secondary | ICD-10-CM | POA: Diagnosis not present

## 2022-01-13 DIAGNOSIS — R0981 Nasal congestion: Secondary | ICD-10-CM | POA: Diagnosis not present

## 2022-01-13 DIAGNOSIS — J019 Acute sinusitis, unspecified: Secondary | ICD-10-CM | POA: Diagnosis not present

## 2022-01-27 DIAGNOSIS — F4323 Adjustment disorder with mixed anxiety and depressed mood: Secondary | ICD-10-CM | POA: Diagnosis not present

## 2022-02-21 DIAGNOSIS — F4323 Adjustment disorder with mixed anxiety and depressed mood: Secondary | ICD-10-CM | POA: Diagnosis not present

## 2022-07-06 DIAGNOSIS — E559 Vitamin D deficiency, unspecified: Secondary | ICD-10-CM | POA: Diagnosis not present

## 2022-07-06 DIAGNOSIS — F988 Other specified behavioral and emotional disorders with onset usually occurring in childhood and adolescence: Secondary | ICD-10-CM | POA: Diagnosis not present

## 2023-05-05 DIAGNOSIS — Z79899 Other long term (current) drug therapy: Secondary | ICD-10-CM | POA: Diagnosis not present

## 2023-05-05 DIAGNOSIS — Z0000 Encounter for general adult medical examination without abnormal findings: Secondary | ICD-10-CM | POA: Diagnosis not present

## 2023-05-05 DIAGNOSIS — E559 Vitamin D deficiency, unspecified: Secondary | ICD-10-CM | POA: Diagnosis not present

## 2023-11-22 DIAGNOSIS — F900 Attention-deficit hyperactivity disorder, predominantly inattentive type: Secondary | ICD-10-CM | POA: Diagnosis not present

## 2023-11-22 DIAGNOSIS — F1210 Cannabis abuse, uncomplicated: Secondary | ICD-10-CM | POA: Diagnosis not present

## 2023-11-22 DIAGNOSIS — G4733 Obstructive sleep apnea (adult) (pediatric): Secondary | ICD-10-CM | POA: Diagnosis not present

## 2023-11-22 DIAGNOSIS — F411 Generalized anxiety disorder: Secondary | ICD-10-CM | POA: Diagnosis not present

## 2024-05-02 IMAGING — DX DG CHEST 2V
2 series · 2 of 2 positions shown · non-contrast
Comparison: Chest radiograph dated August 09, 2009

CLINICAL DATA: Palpitations

EXAM:
CHEST - 2 VIEW

[chest pa]
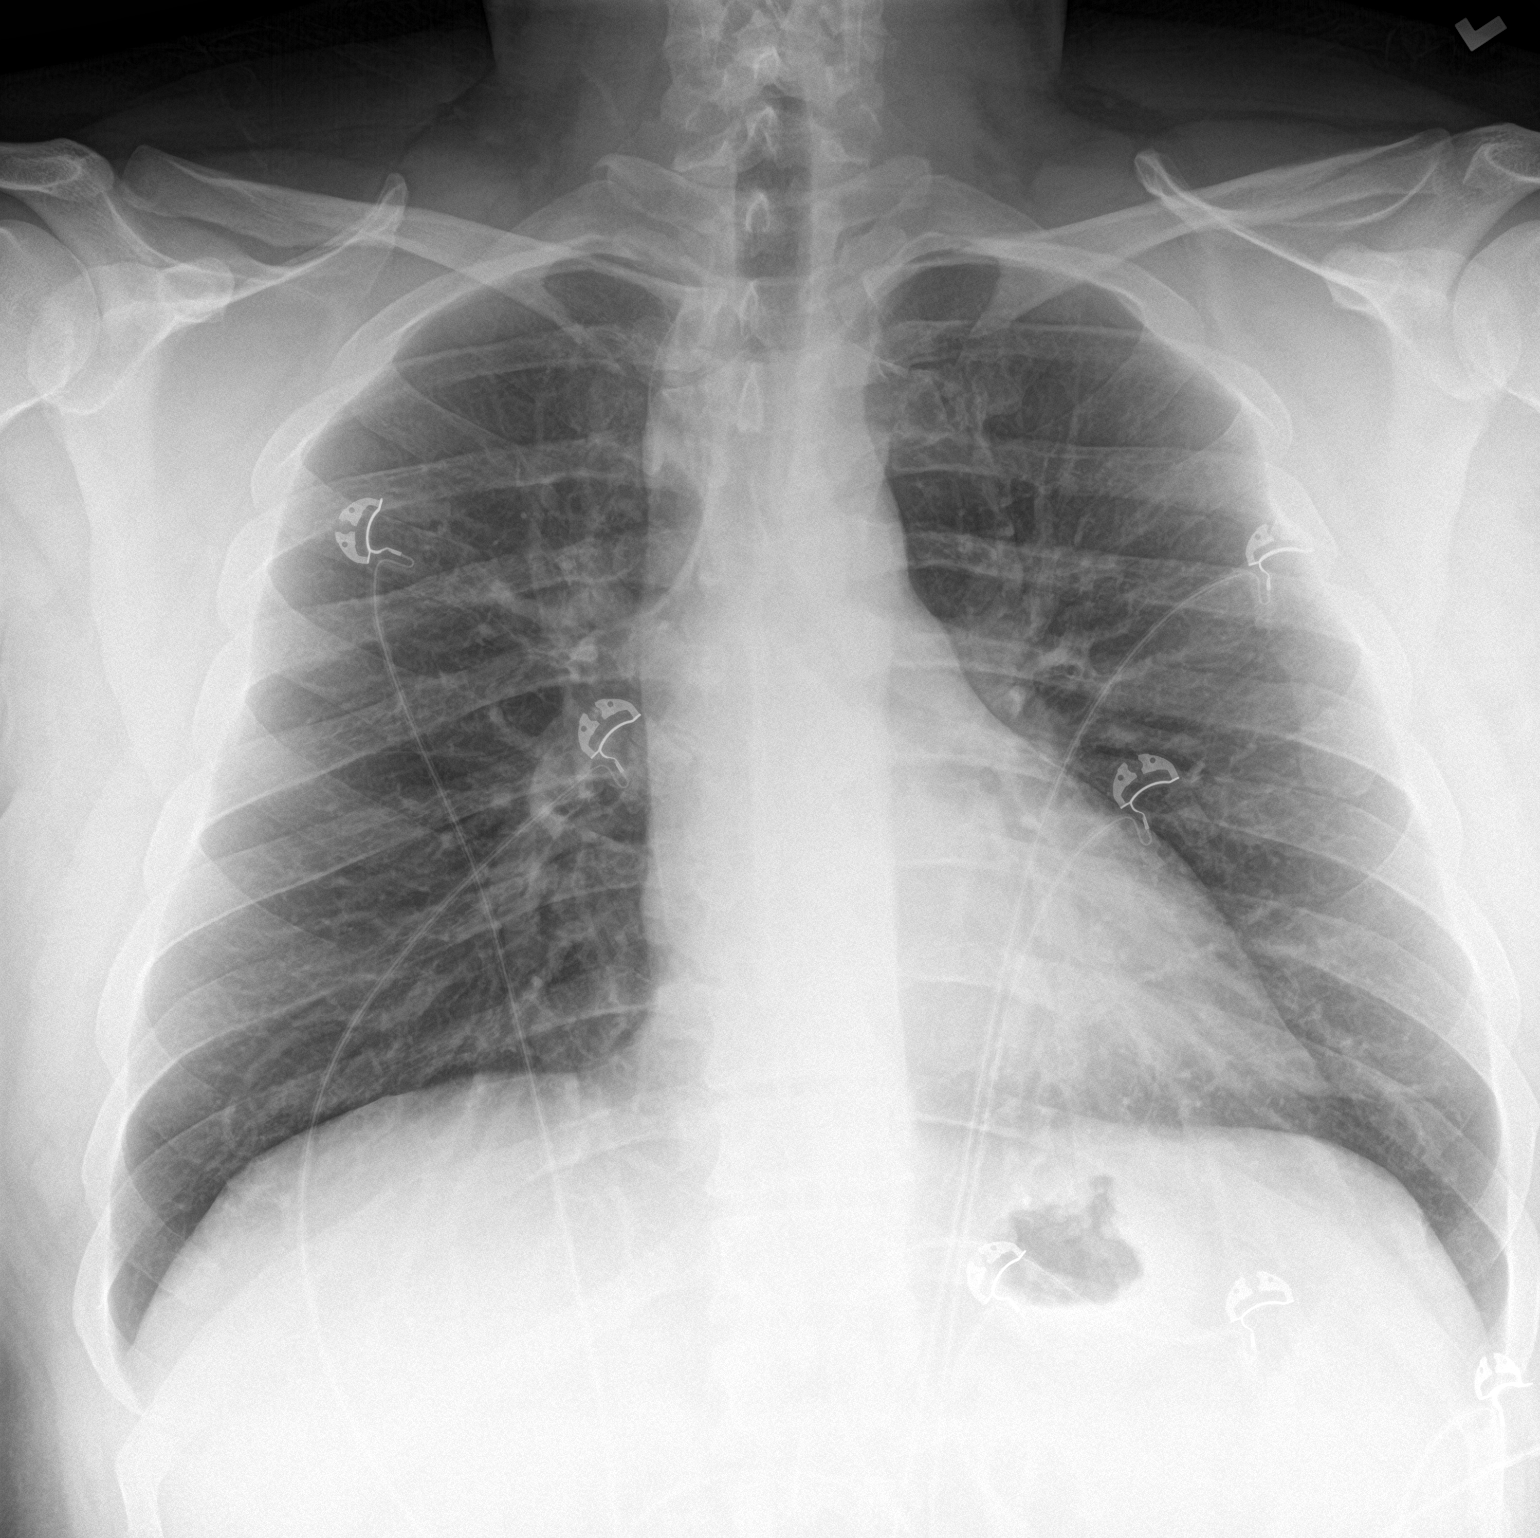

[chest lat]
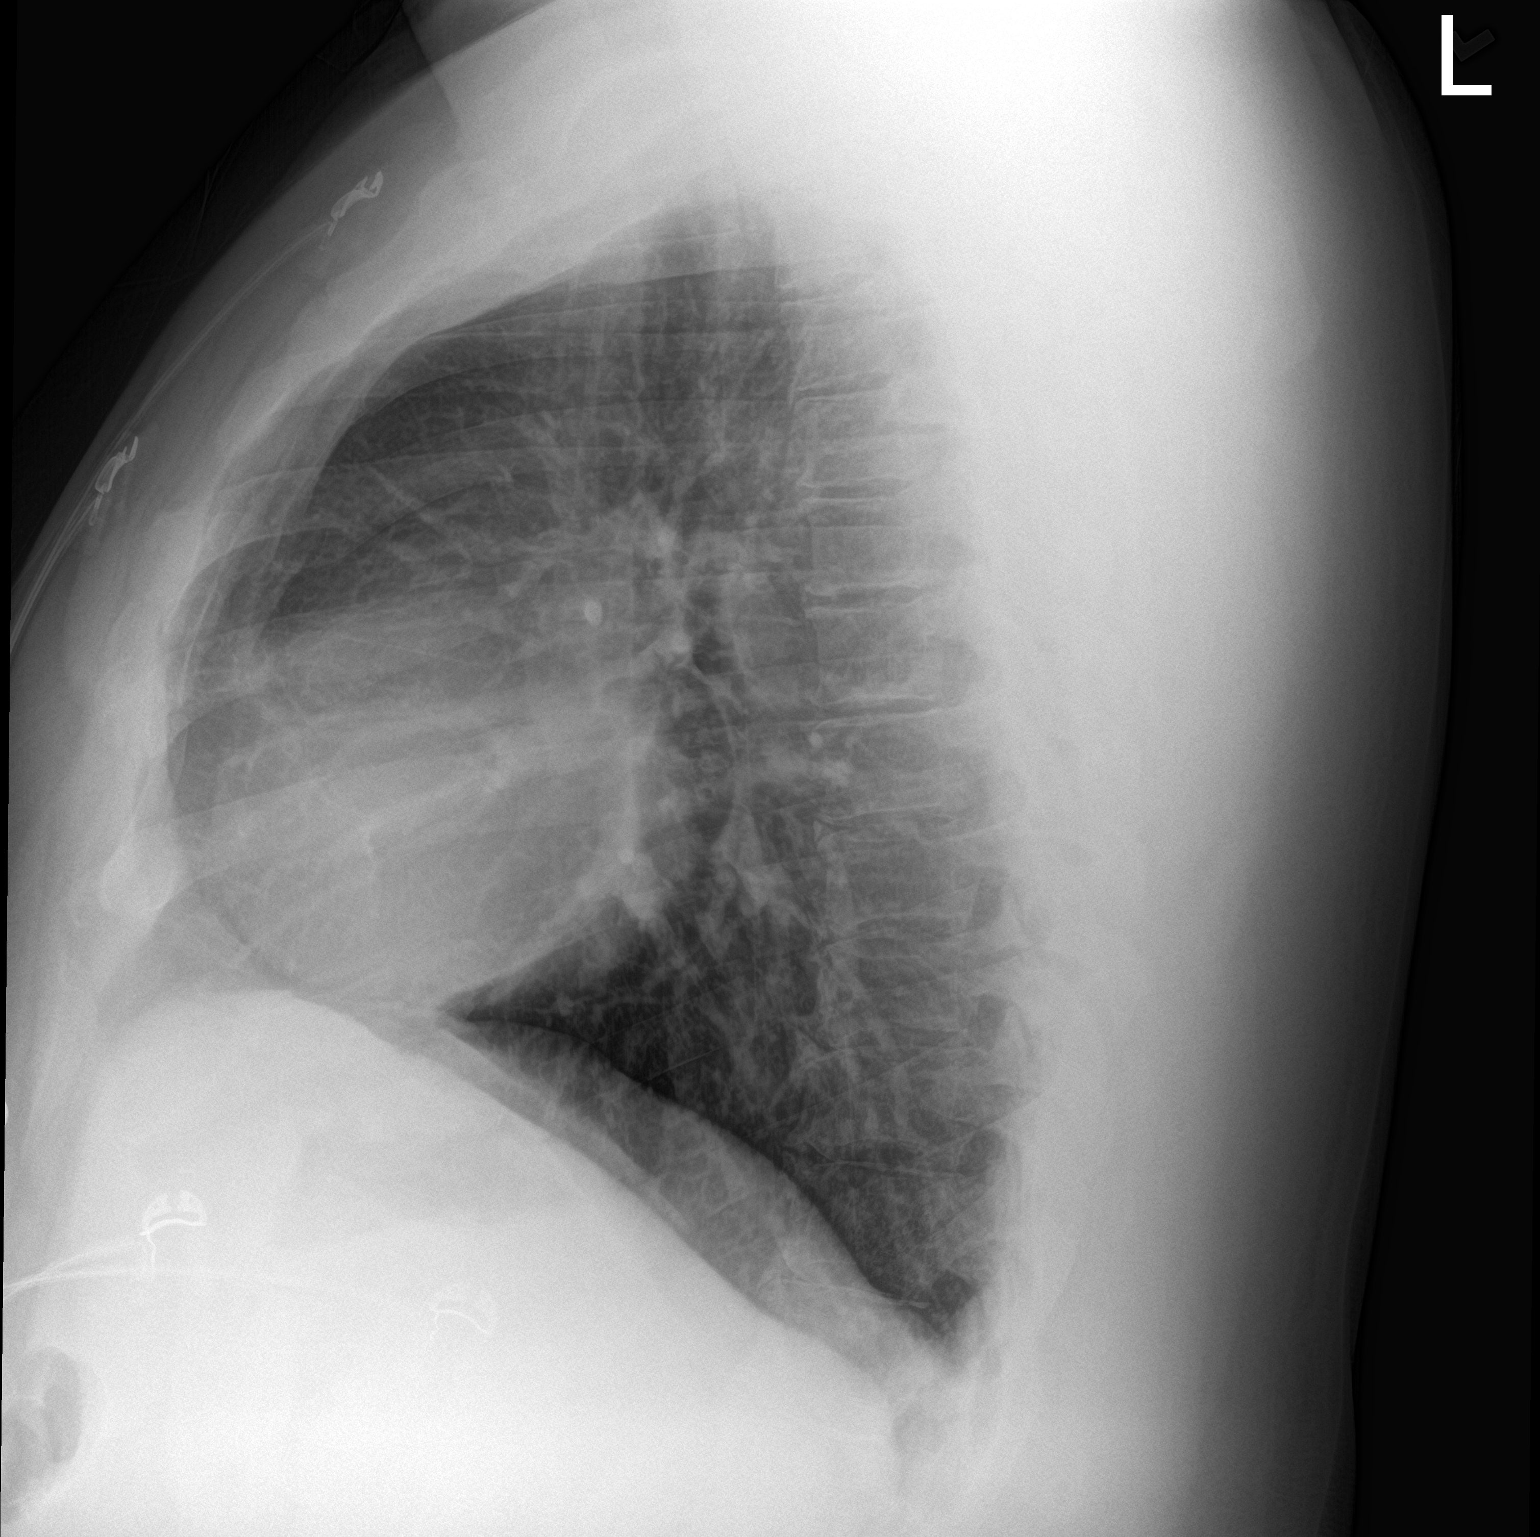

[2 of 2 positions shown; findings below may reference images not displayed]

FINDINGS: The heart size and mediastinal contours are within normal limits.
Both lungs are clear. The visualized skeletal structures are
unremarkable.
IMPRESSION: No active cardiopulmonary disease.
